# Patient Record
Sex: Male | Born: 1955 | Race: White | Hispanic: No | Marital: Married | Smoking: Current every day smoker
Health system: Southern US, Community
[De-identification: ages and names within clinical notes are randomized; demographics above are authoritative.]

---

## 2002-05-20 ENCOUNTER — Emergency Department (HOSPITAL_COMMUNITY): Admission: EM | Admit: 2002-05-20 | Discharge: 2002-05-20 | Payer: Self-pay | Admitting: Emergency Medicine

## 2014-05-06 ENCOUNTER — Emergency Department (HOSPITAL_COMMUNITY)
Admission: EM | Admit: 2014-05-06 | Discharge: 2014-05-06 | Disposition: A | Discharge status: Home / Self Care | Payer: Worker's Compensation | Attending: Emergency Medicine | Admitting: Emergency Medicine

## 2014-05-06 ENCOUNTER — Encounter (HOSPITAL_COMMUNITY): Payer: Self-pay | Admitting: Emergency Medicine

## 2014-05-06 DIAGNOSIS — Y998 Other external cause status: Secondary | ICD-10-CM | POA: Insufficient documentation

## 2014-05-06 DIAGNOSIS — S29019A Strain of muscle and tendon of unspecified wall of thorax, initial encounter: Secondary | ICD-10-CM | POA: Diagnosis not present

## 2014-05-06 DIAGNOSIS — M549 Dorsalgia, unspecified: Secondary | ICD-10-CM | POA: Diagnosis present

## 2014-05-06 DIAGNOSIS — Y929 Unspecified place or not applicable: Secondary | ICD-10-CM | POA: Insufficient documentation

## 2014-05-06 DIAGNOSIS — Y9389 Activity, other specified: Secondary | ICD-10-CM | POA: Diagnosis not present

## 2014-05-06 DIAGNOSIS — X58XXXA Exposure to other specified factors, initial encounter: Secondary | ICD-10-CM | POA: Insufficient documentation

## 2014-05-06 DIAGNOSIS — Z720 Tobacco use: Secondary | ICD-10-CM | POA: Diagnosis not present

## 2014-05-06 MED ORDER — METHOCARBAMOL 500 MG PO TABS: ORAL_TABLET | ORAL | Status: AC

## 2014-05-06 MED ORDER — IBUPROFEN 600 MG PO TABS: 600.0000 mg | ORAL_TABLET | Freq: Four times a day (QID) | ORAL | Status: AC

## 2014-05-06 NOTE — ED Notes (Signed)
Yesterday at work, patient lifted approximately 35 lb. Box overhead onto a pallate and felt sharp pain and catch in mid-thoracic spine.  Denies any prior back problems.  The pain has continued intermittently with turning movement or coughing and lasts 5-10 seconds.  Took one of his wife's Vicodin this morning for pain. No point tenderness on spine or paraspinal muscle tenderness.

## 2014-05-06 NOTE — ED Notes (Signed)
Patient with no complaints at this time. Respirations even and unlabored. Skin warm/dry. Discharge instructions reviewed with patient at this time. Patient given opportunity to voice concerns/ask questions. Patient discharged at this time and left Emergency Department with steady gait.   

## 2014-05-06 NOTE — ED Notes (Signed)
Patient states he was lifting heavy boxes last night at work and felt something pull in his back. States pain continues today.

## 2014-05-06 NOTE — Discharge Instructions (Signed)

## 2014-05-06 NOTE — ED Provider Notes (Signed)
CSN: [id number]     Arrival date & time 05/06/14  1149 History   First MD Initiated Contact with Patient 05/06/14 1356     Chief Complaint  Patient presents with  . Back Pain     (Consider location/radiation/quality/duration/timing/severity/associated sxs/prior Treatment) HPI Comments: Patient is a 58 year old male who presents to the emergency department with a complaint of mid back pain. The patient states that while at work on yesterday 05/03/2014 he was lifting a box that was approximately 35 pounds overhead onto a pallet. During this movement he felt a sharp pain that was accompanied by a cramp or catch in his mid back area. Following this he has pain with any turning movement, or cough. The pain last from 5-10 seconds at a time. Goes away, but comes back. The patient has not been coughing up any blood. He denies any high fever. He is able to speak in complete sentences. He has not had any operations or procedures involving his chest or lung area. The patient took one of his wife's Vicodin tablets this morning. He continues however to have tenderness in the mid back area.  Patient is a 58 y.o. male presenting with back pain. The history is provided by the patient.  Back Pain Location:  Thoracic spine Associated symptoms: no abdominal pain, no chest pain and no dysuria     History reviewed. No pertinent past medical history. History reviewed. No pertinent past surgical history. History reviewed. No pertinent family history. History  Substance Use Topics  . Smoking status: Current Every Day Smoker  . Smokeless tobacco: Not on file  . Alcohol Use: No    Review of Systems  Constitutional: Negative for activity change.       All ROS Neg except as noted in HPI  HENT: Negative for nosebleeds.   Eyes: Negative for photophobia and discharge.  Respiratory: Negative for cough, shortness of breath and wheezing.   Cardiovascular: Negative for chest pain and palpitations.  Gastrointestinal:  Negative for abdominal pain and blood in stool.  Genitourinary: Negative for dysuria, frequency and hematuria.  Musculoskeletal: Positive for back pain. Negative for arthralgias and neck pain.  Skin: Negative.   Neurological: Negative for dizziness, seizures and speech difficulty.  Psychiatric/Behavioral: Negative for hallucinations and confusion.      Allergies  Review of patient's allergies indicates no known allergies.  Home Medications   Prior to Admission medications   Medication Sig Start Date End Date Taking? Authorizing Provider  HYDROcodone-acetaminophen (NORCO/VICODIN) 5-325 MG per tablet Take 1 tablet by mouth once.   Yes Historical Provider, MD  ibuprofen (ADVIL,MOTRIN) 200 MG tablet Take 800 mg by mouth every 8 (eight) hours as needed for moderate pain.   Yes Historical Provider, MD   BP 136/83 mmHg  Pulse 63  Temp(Src) 98.1 F (36.7 C) (Oral)  Resp 16  Ht 5\' 10"  (1.778 m)  Wt 153 lb (69.4 kg)  BMI 21.95 kg/m2  SpO2 100% Physical Exam  Constitutional: He is oriented to person, place, and time. He appears well-developed and well-nourished.  Non-toxic appearance.  HENT:  Head: Normocephalic.  Right Ear: Tympanic membrane and external ear normal.  Left Ear: Tympanic membrane and external ear normal.  Eyes: EOM and lids are normal. Pupils are equal, round, and reactive to light.  Neck: Normal range of motion. Neck supple. Carotid bruit is not present.  Cardiovascular: Normal rate, regular rhythm, normal heart sounds, intact distal pulses and normal pulses.   Pulmonary/Chest: Breath sounds normal. No respiratory distress.  Abdominal: Soft. Bowel sounds are normal. There is no tenderness. There is no guarding.  Musculoskeletal:       Thoracic back: He exhibits decreased range of motion, tenderness and spasm.  Lymphadenopathy:       Head (right side): No submandibular adenopathy present.       Head (left side): No submandibular adenopathy present.    He has no  cervical adenopathy.  Neurological: He is alert and oriented to person, place, and time. He has normal strength. No cranial nerve deficit or sensory deficit.  Skin: Skin is warm and dry.  Psychiatric: He has a normal mood and affect. His speech is normal.  Nursing note and vitals reviewed.   ED Course  Procedures (including critical care time) Labs Review Labs Reviewed - No data to display  Imaging Review No results found.   EKG Interpretation None      MDM  Vital signs stable. Exam is c/w thoracic muscle strain. Rx for ibuprofen and robaxin given to the patient.   Final diagnoses:  Strain of thoracic region, initial encounter    *I have reviewed nursing notes, vital signs, and all appropriate lab and imaging results for this patient.[address]    Noriah Osgood M Myleka Moncure, PA-C 05/06/14 2124  Hilario Quarryanielle S Ray, MD 05/07/14 [phone]

## 2015-05-09 ENCOUNTER — Encounter (INDEPENDENT_AMBULATORY_CARE_PROVIDER_SITE_OTHER): Payer: Self-pay | Admitting: *Deleted

## 2015-11-12 ENCOUNTER — Other Ambulatory Visit (HOSPITAL_COMMUNITY): Payer: Self-pay | Admitting: Family Medicine

## 2015-11-12 ENCOUNTER — Ambulatory Visit (HOSPITAL_COMMUNITY)
Admission: RE | Admit: 2015-11-12 | Discharge: 2015-11-12 | Disposition: A | Discharge status: Home / Self Care | Payer: BLUE CROSS/BLUE SHIELD | Source: Ambulatory Visit | Attending: Family Medicine | Admitting: Family Medicine

## 2015-11-12 DIAGNOSIS — J209 Acute bronchitis, unspecified: Secondary | ICD-10-CM | POA: Diagnosis present

## 2015-11-12 DIAGNOSIS — R918 Other nonspecific abnormal finding of lung field: Secondary | ICD-10-CM | POA: Diagnosis not present

## 2015-11-12 DIAGNOSIS — F17210 Nicotine dependence, cigarettes, uncomplicated: Secondary | ICD-10-CM | POA: Insufficient documentation

## 2015-12-05 ENCOUNTER — Other Ambulatory Visit (HOSPITAL_COMMUNITY): Payer: Self-pay | Admitting: Family Medicine

## 2015-12-05 DIAGNOSIS — R918 Other nonspecific abnormal finding of lung field: Secondary | ICD-10-CM

## 2015-12-18 ENCOUNTER — Ambulatory Visit (HOSPITAL_COMMUNITY)
Admission: RE | Admit: 2015-12-18 | Discharge: 2015-12-18 | Disposition: A | Discharge status: Home / Self Care | Payer: BLUE CROSS/BLUE SHIELD | Source: Ambulatory Visit | Attending: Family Medicine | Admitting: Family Medicine

## 2015-12-18 DIAGNOSIS — I2510 Atherosclerotic heart disease of native coronary artery without angina pectoris: Secondary | ICD-10-CM | POA: Diagnosis not present

## 2015-12-18 DIAGNOSIS — J984 Other disorders of lung: Secondary | ICD-10-CM | POA: Insufficient documentation

## 2015-12-18 DIAGNOSIS — I700 Atherosclerosis of aorta: Secondary | ICD-10-CM | POA: Insufficient documentation

## 2015-12-18 DIAGNOSIS — R918 Other nonspecific abnormal finding of lung field: Secondary | ICD-10-CM | POA: Insufficient documentation

## 2015-12-18 DIAGNOSIS — J432 Centrilobular emphysema: Secondary | ICD-10-CM | POA: Diagnosis not present

## 2016-05-15 ENCOUNTER — Ambulatory Visit (HOSPITAL_COMMUNITY)
Admission: RE | Admit: 2016-05-15 | Discharge: 2016-05-15 | Disposition: A | Discharge status: Home / Self Care | Payer: BLUE CROSS/BLUE SHIELD | Source: Ambulatory Visit | Attending: Family Medicine | Admitting: Family Medicine

## 2016-05-15 ENCOUNTER — Other Ambulatory Visit (HOSPITAL_COMMUNITY): Payer: Self-pay | Admitting: Family Medicine

## 2016-05-15 DIAGNOSIS — R05 Cough: Secondary | ICD-10-CM | POA: Insufficient documentation

## 2016-05-15 DIAGNOSIS — R059 Cough, unspecified: Secondary | ICD-10-CM

## 2016-05-15 DIAGNOSIS — J449 Chronic obstructive pulmonary disease, unspecified: Secondary | ICD-10-CM | POA: Insufficient documentation

## 2016-05-15 DIAGNOSIS — I700 Atherosclerosis of aorta: Secondary | ICD-10-CM | POA: Diagnosis not present

## 2016-05-16 ENCOUNTER — Other Ambulatory Visit (HOSPITAL_COMMUNITY): Payer: Self-pay | Admitting: Family Medicine

## 2016-05-16 DIAGNOSIS — Z122 Encounter for screening for malignant neoplasm of respiratory organs: Secondary | ICD-10-CM

## 2016-05-27 ENCOUNTER — Ambulatory Visit (HOSPITAL_COMMUNITY): Payer: BLUE CROSS/BLUE SHIELD

## 2018-02-22 IMAGING — DX DG CHEST 2V
2 series · 2 of 2 positions shown · non-contrast
Comparison: No recent prior.

CLINICAL DATA: Cough.  Congestion.

EXAM:
CHEST  2 VIEW

[chest pa]
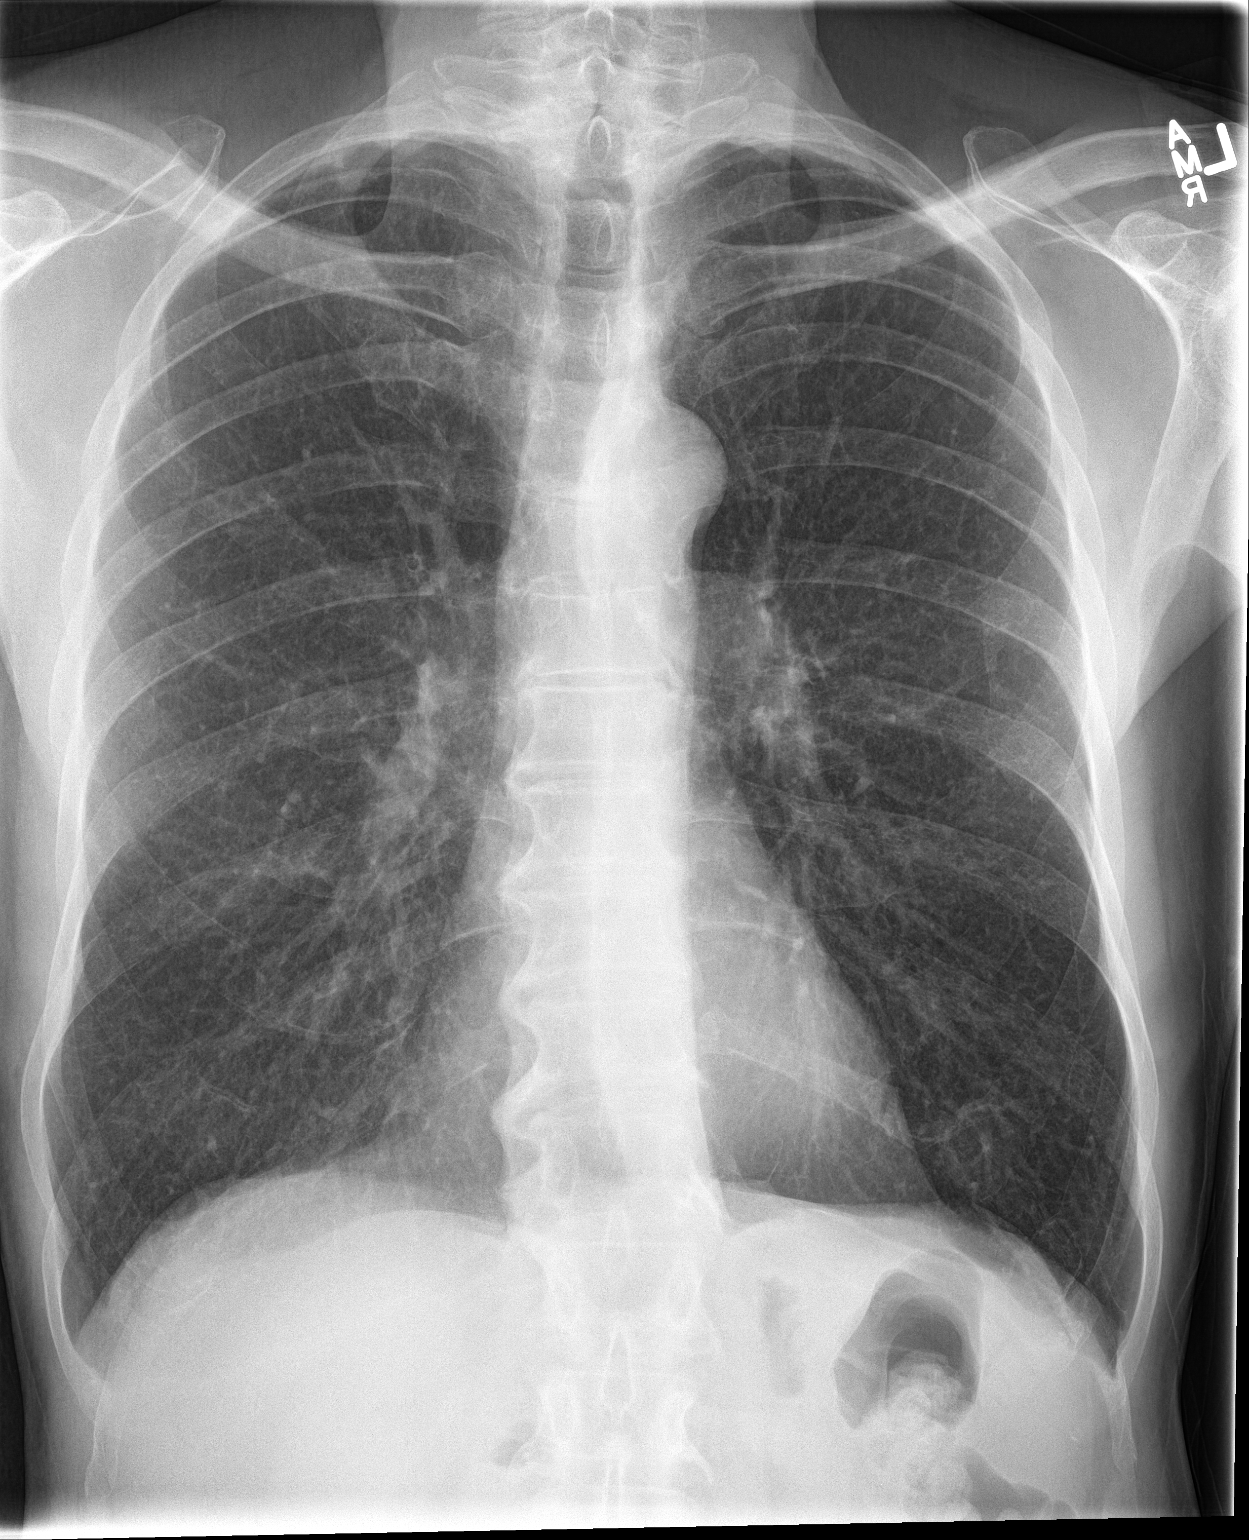

[chest lat]
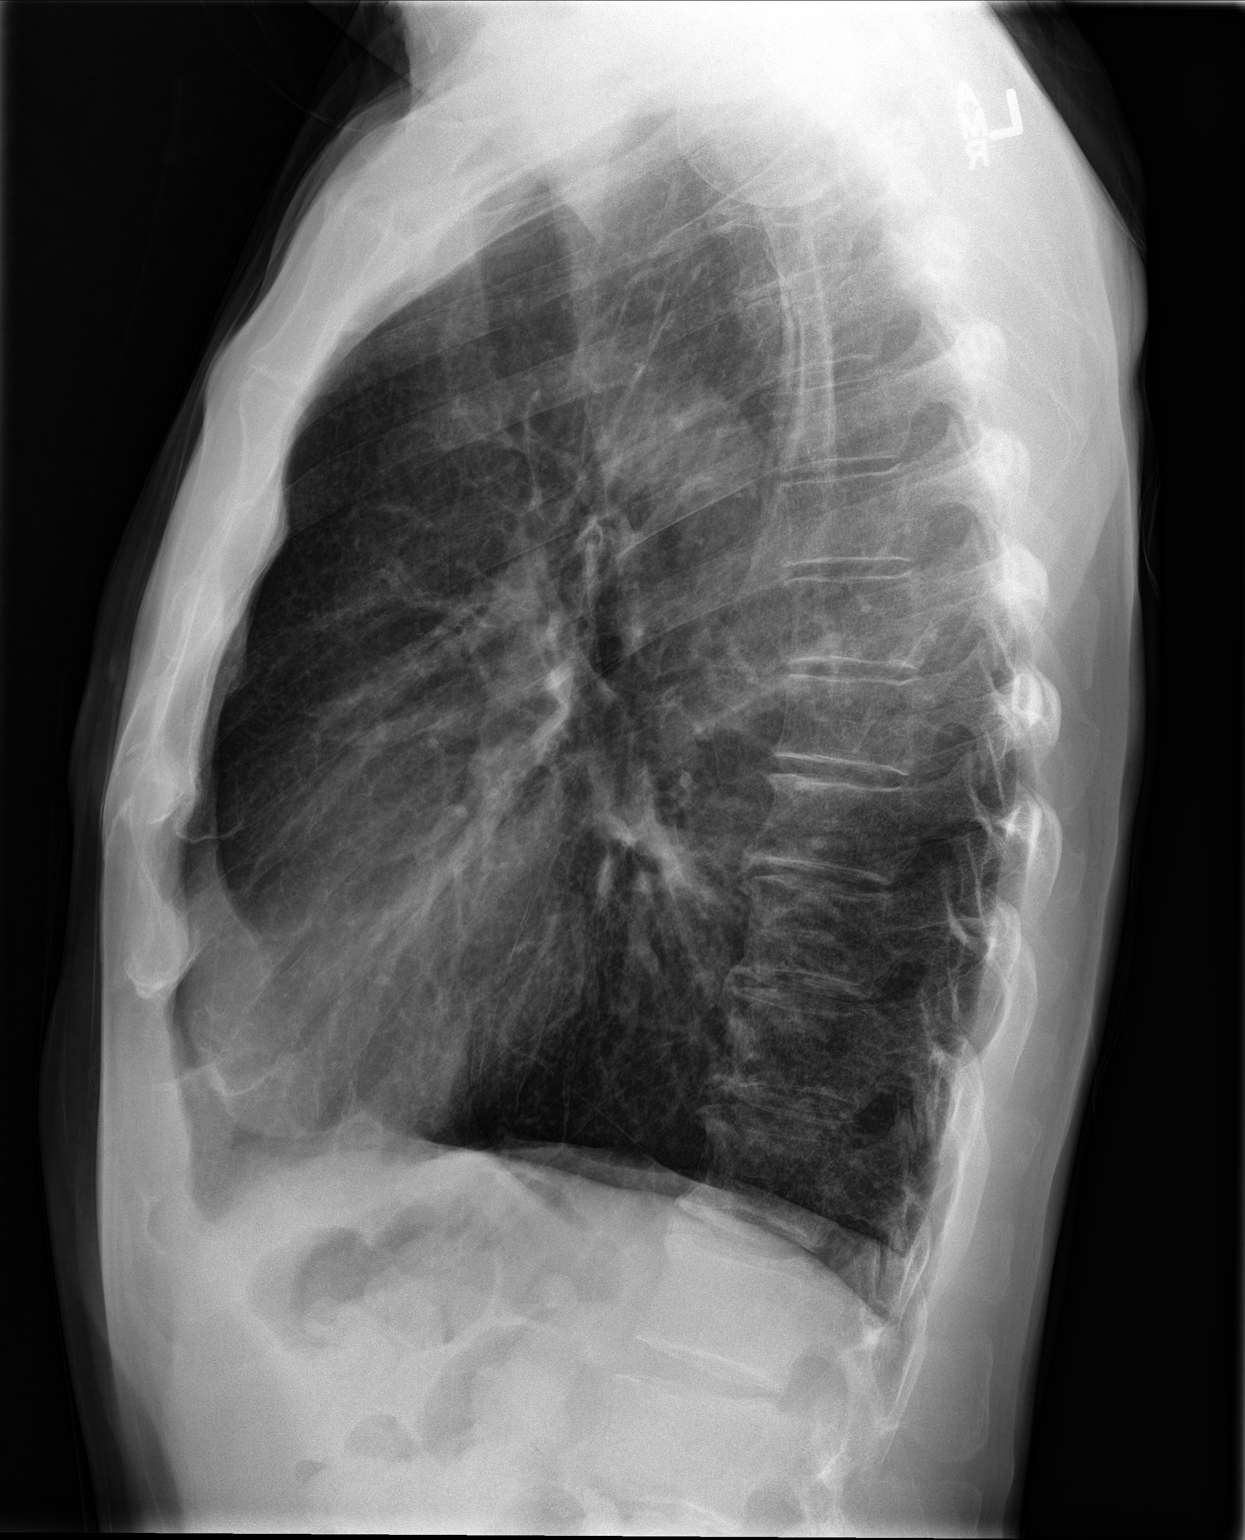

[2 of 2 positions shown; findings below may reference images not displayed]

FINDINGS: Mediastinum and hilar structures normal. Mild biapical pleural
thickening noted most likely related to scarring. However there is
slight nodularity in the right apex and to exclude a true pulmonary
nodule nonenhanced chest CT is suggested. Hyperexpansion of both
lung fields noted consistent with COPD. Cardiomegaly with normal
pulmonary vascularity. No pleural effusion or pneumothorax.
Degenerative changes thoracic spine .
IMPRESSION: Questionable nodular density right apex, most likely related to
pleural parenchymal scarring. However nonenhanced chest CT is
suggested to further evaluate.

## 2018-03-30 IMAGING — CT CT CHEST W/O CM
1 of 2 series · 13 of 32 positions shown, 19 images · non-contrast
Comparison: 11/12/2015 chest x-ray

CLINICAL DATA: Abnormal chest x-ray demonstrating right apical
nodular opacity

EXAM:
CT CHEST WITHOUT CONTRAST
TECHNIQUE: Multidetector CT imaging of the chest was performed following the
standard protocol without IV contrast.

[Series 2: routine chest wo without · axial · non-contrast · 0.66mm/px · z∈[-298,-38]mm · 13 of 150 slices shown, 19 images]
[im 10/150  soft-tissue]
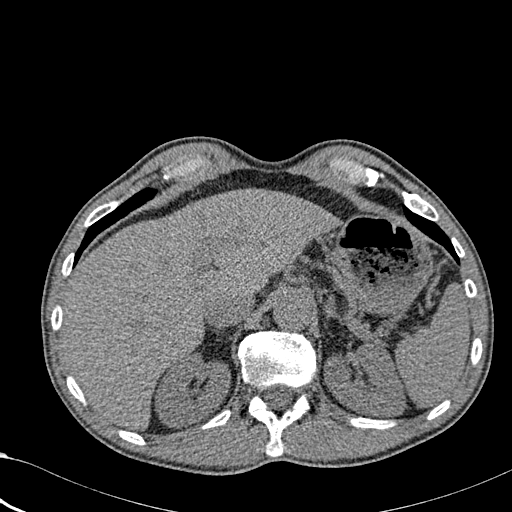
[im 10/150  bone]
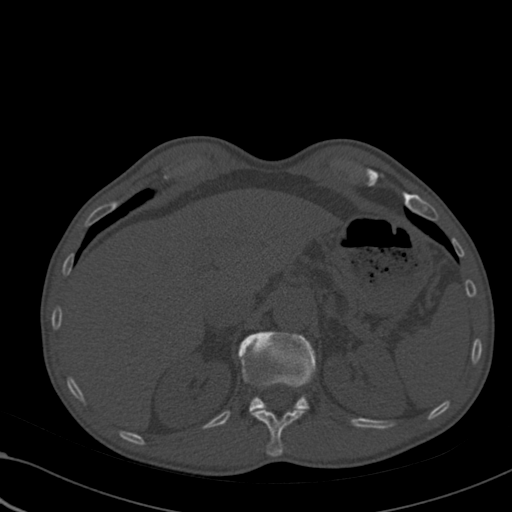
[im 20/150  soft-tissue]
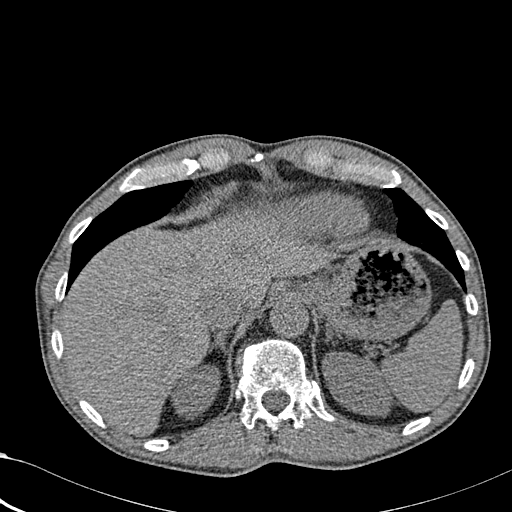
[im 30/150  soft-tissue]
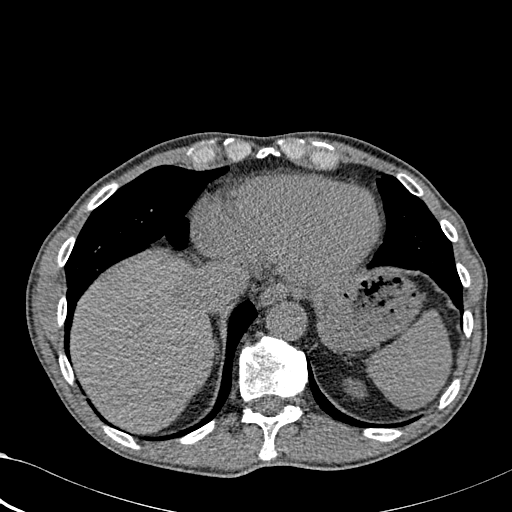
[im 40/150  soft-tissue]
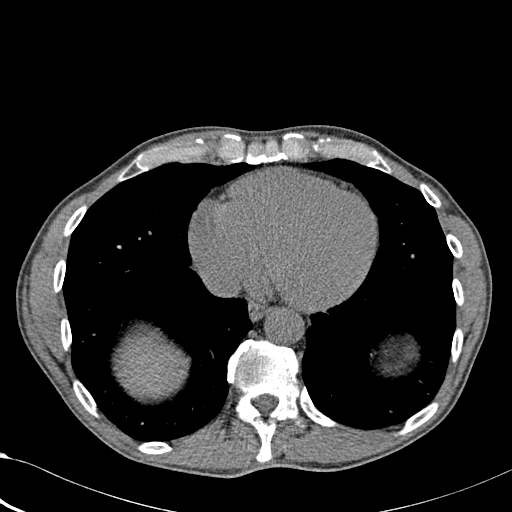
[im 50/150  soft-tissue]
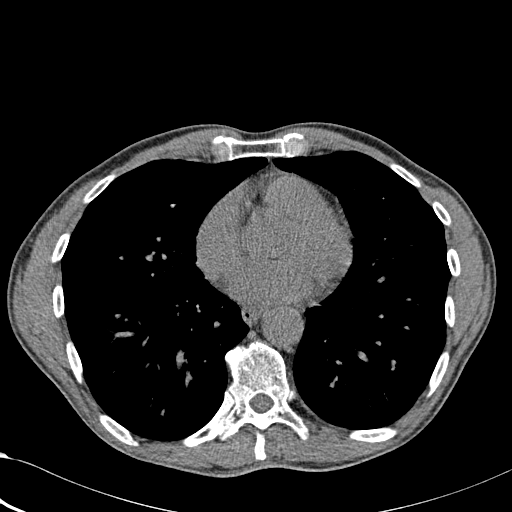
[im 60/150  soft-tissue]
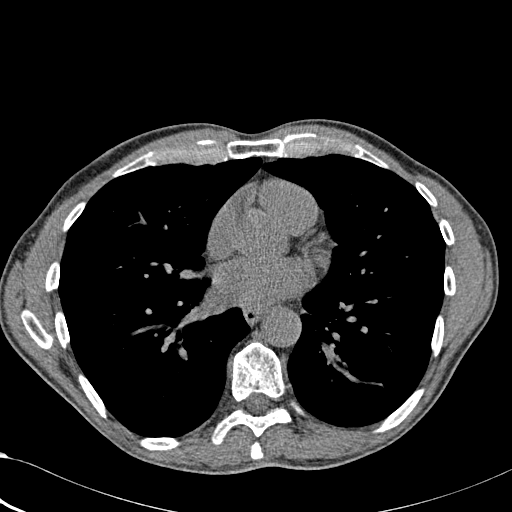
[im 80/150  soft-tissue]
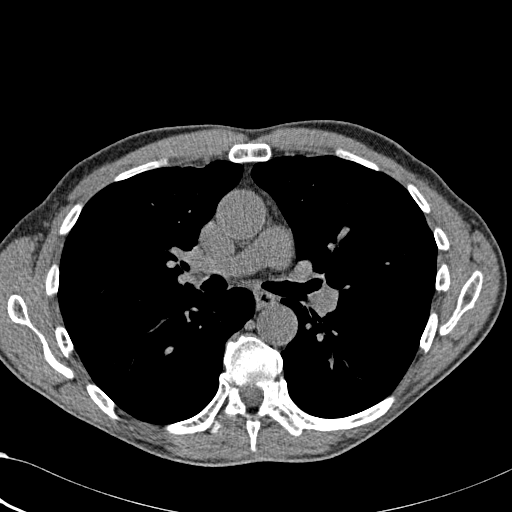
[im 90/150  soft-tissue]
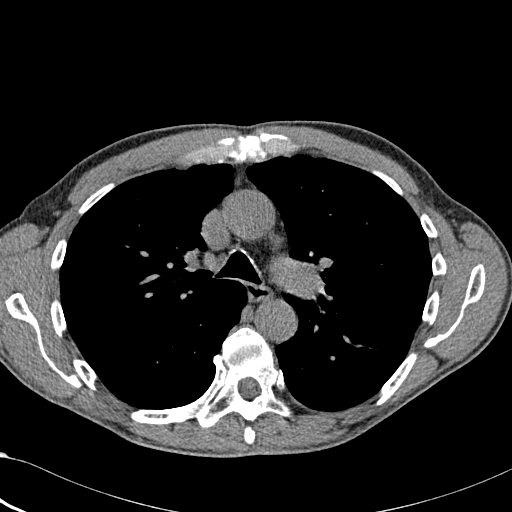
[im 100/150  soft-tissue]
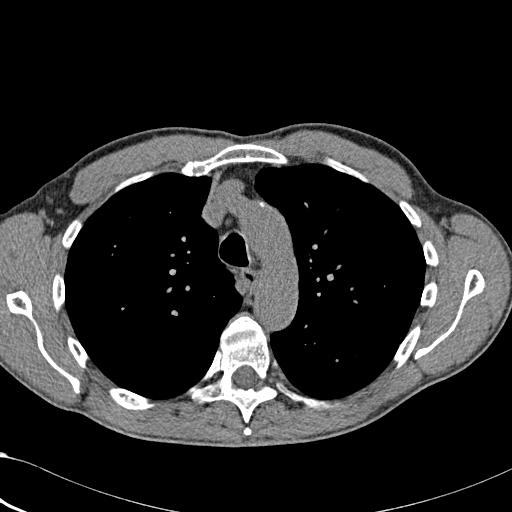
[im 100/150  bone]
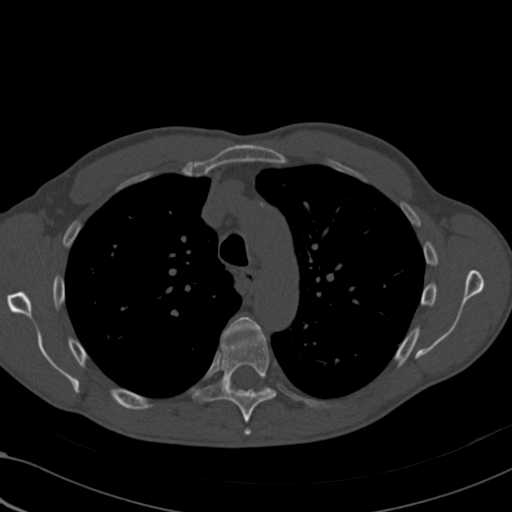
[im 110/150  soft-tissue]
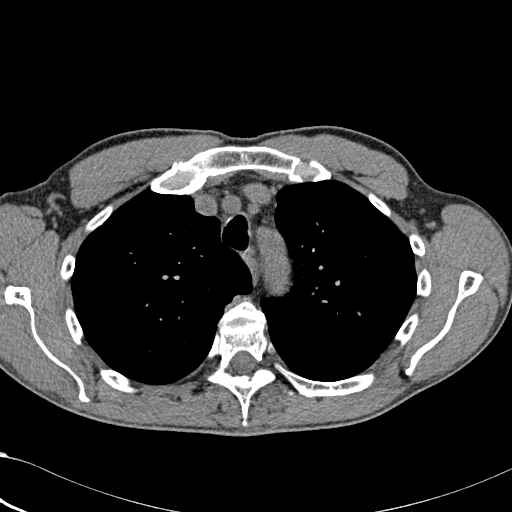
[im 110/150  lung]
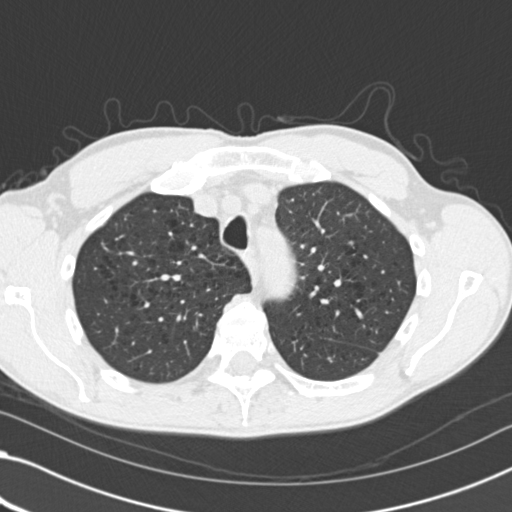
[im 120/150  soft-tissue]
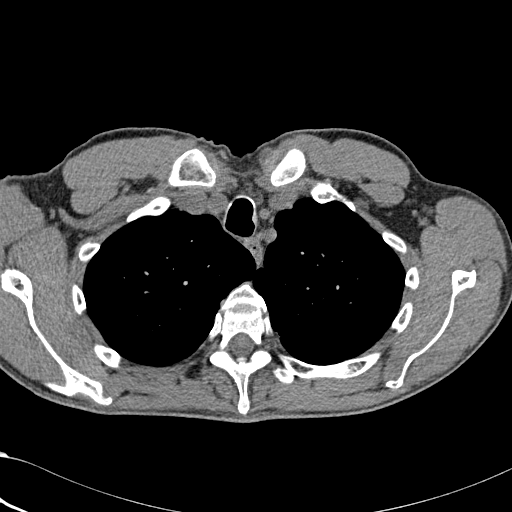
[im 120/150  lung]
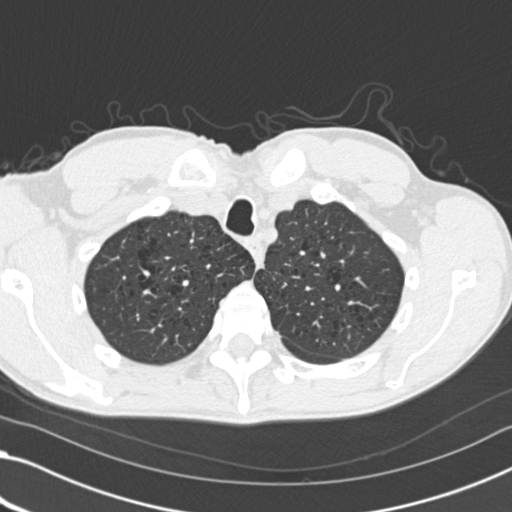
[im 130/150  soft-tissue]
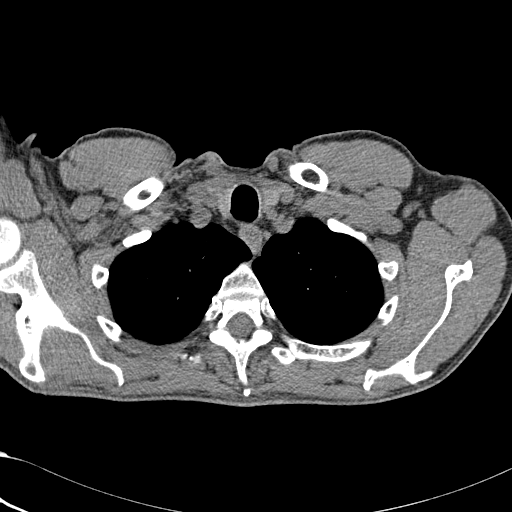
[im 130/150  lung]
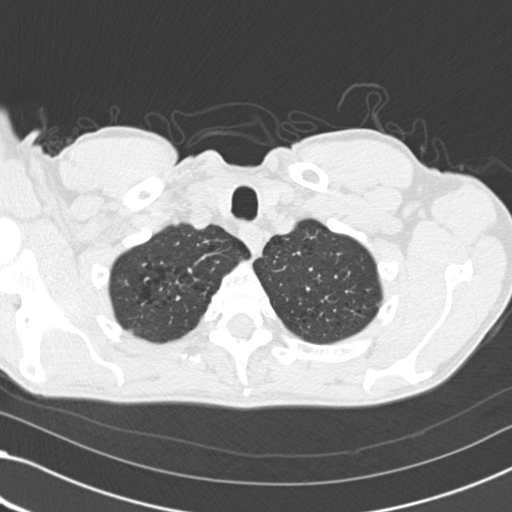
[im 140/150  soft-tissue]
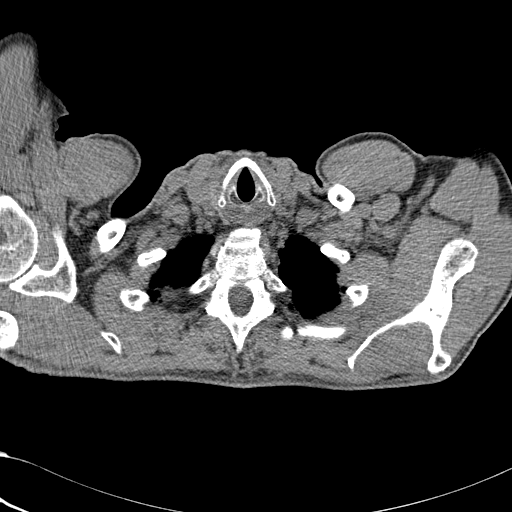
[im 140/150  lung]
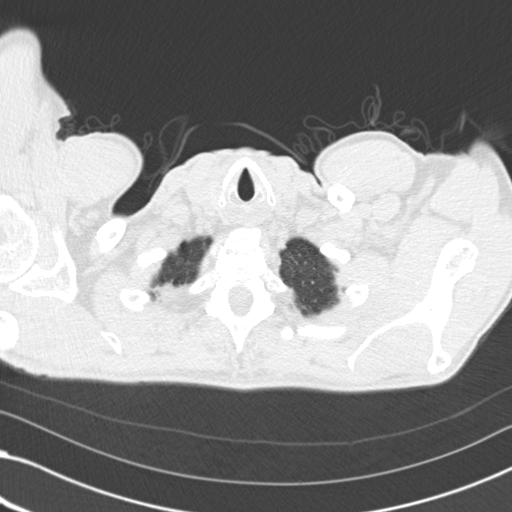

[13 of 32 positions shown; findings below may reference images not displayed]

FINDINGS: Mediastinum/Lymph Nodes: Thoracic aortic atherosclerosis noted
without significant aneurysm. No adenopathy within the limits of
noncontrast imaging. Coronary atherosclerosis noted. Normal heart
size. No pericardial effusion. No hiatal hernia.

Lungs/Pleura: Biapical pleural parenchymal scarring noted, slightly
more nodular and pronounced on the right side accounting for the
chest x-ray finding. Centrilobular upper lobe predominant emphysema
pattern. Mild hyperinflation. Small left midlung pulmonary bleb
measures 17 mm, image 68. Otherwise lungs remain clear. No focal
airspace process, collapse or consolidation. No interstitial
process, edema, pleural abnormality, pleural effusion or
pneumothorax. Trachea and central airways remain patent. No mucous
plugging or bronchiectasis.

Upper abdomen: No acute findings.

Musculoskeletal: No chest wall soft tissue abnormality or asymmetry.
Diffuse thoracic degenerative changes and spondylosis. No acute
compression fracture. Sternum appears intact.
IMPRESSION: Biapical pleural-parenchymal scarring, slightly more pronounced and
nodular on the right accounting for the chest x-ray finding.

Centrilobular emphysema

Thoracic aortic atherosclerosis and native coronary atherosclerosis

No acute intra thoracic process by noncontrast CT.

## 2018-06-02 DEATH — deceased
